# Patient Record
Sex: Female | Born: 1980 | Hispanic: No | Marital: Single | Smoking: Never smoker
Health system: Southern US, Community
[De-identification: ages and names within clinical notes are randomized; demographics above are authoritative.]

## PROBLEM LIST (undated history)

## (undated) HISTORY — PX: CARDIAC SURGERY: SHX584

---

## 1999-05-02 ENCOUNTER — Encounter: Admission: RE | Admit: 1999-05-02 | Discharge: 1999-05-04 | Payer: Self-pay | Admitting: *Deleted

## 1999-09-20 ENCOUNTER — Other Ambulatory Visit: Admission: RE | Admit: 1999-09-20 | Discharge: 1999-09-20 | Payer: Self-pay | Admitting: Obstetrics & Gynecology

## 1999-10-16 ENCOUNTER — Other Ambulatory Visit: Admission: RE | Admit: 1999-10-16 | Discharge: 1999-10-16 | Payer: Self-pay | Admitting: Obstetrics and Gynecology

## 2000-04-01 ENCOUNTER — Other Ambulatory Visit: Admission: RE | Admit: 2000-04-01 | Discharge: 2000-04-01 | Payer: Self-pay | Admitting: Obstetrics & Gynecology

## 2001-07-24 ENCOUNTER — Other Ambulatory Visit: Admission: RE | Admit: 2001-07-24 | Discharge: 2001-07-24 | Payer: Self-pay | Admitting: Obstetrics & Gynecology

## 2002-08-05 ENCOUNTER — Other Ambulatory Visit: Admission: RE | Admit: 2002-08-05 | Discharge: 2002-08-05 | Payer: Self-pay | Admitting: Obstetrics & Gynecology

## 2003-11-01 ENCOUNTER — Other Ambulatory Visit: Admission: RE | Admit: 2003-11-01 | Discharge: 2003-11-01 | Payer: Self-pay | Admitting: Obstetrics & Gynecology

## 2014-09-10 ENCOUNTER — Emergency Department (HOSPITAL_COMMUNITY)
Admission: EM | Admit: 2014-09-10 | Discharge: 2014-09-10 | Disposition: A | Discharge status: Home / Self Care | Payer: Medicaid Other | Attending: Emergency Medicine | Admitting: Emergency Medicine

## 2014-09-10 ENCOUNTER — Emergency Department (HOSPITAL_COMMUNITY): Payer: Medicaid Other

## 2014-09-10 ENCOUNTER — Encounter (HOSPITAL_COMMUNITY): Payer: Self-pay | Admitting: Nurse Practitioner

## 2014-09-10 DIAGNOSIS — J181 Lobar pneumonia, unspecified organism: Secondary | ICD-10-CM | POA: Diagnosis not present

## 2014-09-10 DIAGNOSIS — R509 Fever, unspecified: Secondary | ICD-10-CM | POA: Diagnosis present

## 2014-09-10 DIAGNOSIS — R05 Cough: Secondary | ICD-10-CM

## 2014-09-10 DIAGNOSIS — R059 Cough, unspecified: Secondary | ICD-10-CM

## 2014-09-10 DIAGNOSIS — Z792 Long term (current) use of antibiotics: Secondary | ICD-10-CM | POA: Insufficient documentation

## 2014-09-10 DIAGNOSIS — J189 Pneumonia, unspecified organism: Secondary | ICD-10-CM

## 2014-09-10 LAB — COMPREHENSIVE METABOLIC PANEL
ALT: 14 U/L (ref 0–35)
AST: 18 U/L (ref 0–37)
Albumin: 3.7 g/dL (ref 3.5–5.2)
Alkaline Phosphatase: 52 U/L (ref 39–117)
Anion gap: 9 (ref 5–15)
BUN: 6 mg/dL (ref 6–23)
CO2: 23 mmol/L (ref 19–32)
Calcium: 8.8 mg/dL (ref 8.4–10.5)
Chloride: 101 mmol/L (ref 96–112)
Creatinine, Ser: 0.63 mg/dL (ref 0.50–1.10)
GFR calc non Af Amer: >90 mL/min (ref >90)
Glucose, Bld: 108 mg/dL — ABNORMAL HIGH (ref 70–99)
Potassium: 3.4 mmol/L — ABNORMAL LOW (ref 3.5–5.1)
Sodium: 133 mmol/L — ABNORMAL LOW (ref 135–145)
TOTAL PROTEIN: 6.6 g/dL (ref 6.0–8.3)
Total Bilirubin: 0.5 mg/dL (ref 0.3–1.2)

## 2014-09-10 LAB — URINE MICROSCOPIC-ADD ON

## 2014-09-10 LAB — CBC WITH DIFFERENTIAL/PLATELET
BASOS PCT: 0 % (ref 0–1)
Basophils Absolute: 0 10*3/uL (ref 0.0–0.1)
EOS ABS: 0 10*3/uL (ref 0.0–0.7)
Eosinophils Relative: 0 % (ref 0–5)
HCT: 40.4 % (ref 36.0–46.0)
HEMOGLOBIN: 14.1 g/dL (ref 12.0–15.0)
Lymphocytes Relative: 15 % (ref 12–46)
Lymphs Abs: 1.1 10*3/uL (ref 0.7–4.0)
MCH: 30.6 pg (ref 26.0–34.0)
MCHC: 34.9 g/dL (ref 30.0–36.0)
MCV: 87.6 fL (ref 78.0–100.0)
Monocytes Absolute: 0.6 10*3/uL (ref 0.1–1.0)
Monocytes Relative: 8 % (ref 3–12)
Neutro Abs: 5.5 10*3/uL (ref 1.7–7.7)
Neutrophils Relative %: 77 % (ref 43–77)
Platelets: 187 10*3/uL (ref 150–400)
RBC: 4.61 MIL/uL (ref 3.87–5.11)
RDW: 12.6 % (ref 11.5–15.5)
WBC: 7.2 10*3/uL (ref 4.0–10.5)

## 2014-09-10 LAB — URINALYSIS, ROUTINE W REFLEX MICROSCOPIC
BILIRUBIN URINE: NEGATIVE
GLUCOSE, UA: NEGATIVE mg/dL
Ketones, ur: 15 mg/dL — AB
Leukocytes, UA: NEGATIVE
Nitrite: POSITIVE — AB
PH: 7 (ref 5.0–8.0)
PROTEIN: 30 mg/dL — AB
Specific Gravity, Urine: 1.02 (ref 1.005–1.030)
UROBILINOGEN UA: 2 mg/dL — AB (ref 0.0–1.0)

## 2014-09-10 LAB — I-STAT CG4 LACTIC ACID, ED: LACTIC ACID, VENOUS: 0.81 mmol/L (ref 0.5–2.0)

## 2014-09-10 MED ORDER — ONDANSETRON HCL 4 MG/2ML IJ SOLN
4.0000 mg | Freq: Once | INTRAMUSCULAR | Status: DC
Start: 2014-09-10 — End: 2014-09-10
  Filled 2014-09-10: qty 2

## 2014-09-10 MED ORDER — OSELTAMIVIR PHOSPHATE 75 MG PO CAPS
75.0000 mg | ORAL_CAPSULE | Freq: Once | ORAL | Status: DC
  Filled 2014-09-10: qty 1

## 2014-09-10 MED ORDER — OSELTAMIVIR PHOSPHATE 75 MG PO CAPS: 75.0000 mg | ORAL_CAPSULE | Freq: Two times a day (BID) | ORAL | Status: AC

## 2014-09-10 MED ORDER — AZITHROMYCIN 250 MG PO TABS: 250.0000 mg | ORAL_TABLET | Freq: Every day | ORAL | Status: AC

## 2014-09-10 MED ORDER — DEXTROSE 5 % IV SOLN: 500.0000 mg | Freq: Once | INTRAVENOUS | Status: DC

## 2014-09-10 MED ORDER — OSELTAMIVIR PHOSPHATE 75 MG PO CAPS: 75.0000 mg | ORAL_CAPSULE | Freq: Once | ORAL | Status: DC

## 2014-09-10 MED ORDER — KETOROLAC TROMETHAMINE 30 MG/ML IJ SOLN
30.0000 mg | Freq: Once | INTRAMUSCULAR | Status: DC
  Filled 2014-09-10: qty 1

## 2014-09-10 MED ORDER — ACETAMINOPHEN 500 MG PO TABS
1000.0000 mg | ORAL_TABLET | Freq: Once | ORAL | Status: AC
  Administered 2014-09-10: 1000 mg via ORAL
  Filled 2014-09-10: qty 2

## 2014-09-10 MED ORDER — SODIUM CHLORIDE 0.9 % IV BOLUS (SEPSIS)
1000.0000 mL | Freq: Once | INTRAVENOUS | Status: AC
  Administered 2014-09-10: 1000 mL via INTRAVENOUS

## 2014-09-10 MED ORDER — AMOXICILLIN-POT CLAVULANATE 875-125 MG PO TABS: 1.0000 | ORAL_TABLET | Freq: Two times a day (BID) | ORAL | Status: AC

## 2014-09-10 MED ORDER — AMOXICILLIN-POT CLAVULANATE 875-125 MG PO TABS
1.0000 | ORAL_TABLET | Freq: Once | ORAL | Status: AC
  Administered 2014-09-10: 1 via ORAL
  Filled 2014-09-10: qty 1

## 2014-09-10 MED ORDER — AZITHROMYCIN 250 MG PO TABS
250.0000 mg | ORAL_TABLET | Freq: Once | ORAL | Status: AC
  Administered 2014-09-10: 250 mg via ORAL
  Filled 2014-09-10: qty 1

## 2014-09-10 NOTE — ED Notes (Addendum)
She c/o headache, dizziness, n/v, fevers since Thursday. She took ibuprofen at 0600 this am with no relief.

## 2014-09-10 NOTE — ED Provider Notes (Signed)
CSN: [id number]     Arrival date & time 09/10/14  1407 History   First MD Initiated Contact with Patient 09/10/14 1410     Chief Complaint  Patient presents with  . Fever   HPI  Patient is a 34 year old female who presents emergency room for evaluation of fever, malaise, myalgias, cough 3 days. Patient states that on Thursday she became very fatigued and developed a fever and felt . She is having all over body aches. She is also having a dry cough with some headache. She denies rhinorrhea or congestion. Patient states that her headache came on gradually. She states that is all over. She denies any neck stiffness or rash. Her son was recently sick with URI past 2 weeks. She has no other sick contacts. Patient has been trying Tylenol and ibuprofen at home with little relief. Patient states she is otherwise healthy.  History reviewed. No pertinent past medical history. Past Surgical History  Procedure Laterality Date  . Cardiac surgery     History reviewed. No pertinent family history. History  Substance Use Topics  . Smoking status: Never Smoker   . Smokeless tobacco: Not on file  . Alcohol Use: Yes   OB History    No data available     Review of Systems  Constitutional: Negative for fever, chills and fatigue.  HENT: Negative for congestion, rhinorrhea and trouble swallowing.   Respiratory: Positive for cough. Negative for chest tightness and shortness of breath.   Cardiovascular: Negative for chest pain, palpitations and leg swelling.  Gastrointestinal: Positive for nausea and vomiting. Negative for abdominal pain, diarrhea and constipation.  Genitourinary: Negative for dysuria, urgency, frequency, hematuria, vaginal discharge and difficulty urinating.  Musculoskeletal: Negative for neck pain and neck stiffness.  Skin: Negative for rash.  Neurological: Positive for dizziness and headaches.  All other systems reviewed and are negative.     Allergies  Codeine; Iodine;  Levaquin; Rocephin; and Tequin  Home Medications   Prior to Admission medications   Medication Sig Start Date End Date Taking? Authorizing Provider  amoxicillin-clavulanate (AUGMENTIN) 875-125 MG per tablet Take 1 tablet by mouth 2 (two) times daily. One po bid x 7 days 09/10/14   Aliena Ghrist A Forcucci, PA-C  azithromycin (ZITHROMAX) 250 MG tablet Take 1 tablet (250 mg total) by mouth daily. Take first 2 tablets together, then 1 every day until finished. 09/10/14   Annalyssa Thune A Forcucci, PA-C  ibuprofen (ADVIL,MOTRIN) 200 MG tablet Take 200 mg by mouth every 6 (six) hours as needed for fever.   Yes Historical Provider, MD  oseltamivir (TAMIFLU) 75 MG capsule Take 1 capsule (75 mg total) by mouth every 12 (twelve) hours. 09/10/14   Giuliano Preece A Forcucci, PA-C   BP 115/71 mmHg  Pulse 77  Temp(Src) 99.6 F (37.6 C) (Oral)  Resp 20  Ht 5\' 7"  (1.702 m)  Wt 128 lb (58.06 kg)  BMI 20.04 kg/m2  SpO2 99% Physical Exam  Constitutional: She is oriented to person, place, and time. She appears well-developed and well-nourished. No distress.  HENT:  Head: Normocephalic and atraumatic.  Mouth/Throat: Oropharynx is clear and moist. No oropharyngeal exudate.  Eyes: Conjunctivae and EOM are normal. Pupils are equal, round, and reactive to light. No scleral icterus.  Neck: Normal range of motion. Neck supple. No JVD present. No Brudzinski's sign and no Kernig's sign noted. No thyromegaly present.  Cardiovascular: Normal rate, regular rhythm, normal heart sounds and intact distal pulses.  Exam reveals no gallop and no friction rub.  No murmur heard. Pulmonary/Chest: Effort normal and breath sounds normal. No respiratory distress. She has no wheezes. She has no rales. She exhibits no tenderness.  Abdominal: Soft. Bowel sounds are normal. She exhibits no distension and no mass. There is no tenderness. There is no rebound and no guarding.  Lymphadenopathy:    She has no cervical adenopathy.  Neurological: She is  alert and oriented to person, place, and time. She has normal strength. No cranial nerve deficit or sensory deficit. Coordination normal.  Skin: Skin is warm and dry. She is not diaphoretic.  Psychiatric: She has a normal mood and affect. Her behavior is normal. Judgment and thought content normal.  Nursing note and vitals reviewed.   ED Course  Procedures (including critical care time) Labs Review Labs Reviewed  COMPREHENSIVE METABOLIC PANEL - Abnormal; Notable for the following:    Sodium 133 (*)    Potassium 3.4 (*)    Glucose, Bld 108 (*)    All other components within normal limits  URINALYSIS, ROUTINE W REFLEX MICROSCOPIC - Abnormal; Notable for the following:    APPearance CLOUDY (*)    Hgb urine dipstick LARGE (*)    Ketones, ur 15 (*)    Protein, ur 30 (*)    Urobilinogen, UA 2.0 (*)    Nitrite POSITIVE (*)    All other components within normal limits  URINE MICROSCOPIC-ADD ON - Abnormal; Notable for the following:    Squamous Epithelial / LPF FEW (*)    Bacteria, UA MANY (*)    All other components within normal limits  URINE CULTURE  CULTURE, BLOOD (ROUTINE X 2)  CULTURE, BLOOD (ROUTINE X 2)  CBC WITH DIFFERENTIAL/PLATELET  I-STAT CG4 LACTIC ACID, ED    Imaging Review Dg Chest 2 View  09/10/2014   CLINICAL DATA:  Cough and fever  EXAM: CHEST  2 VIEW  COMPARISON:  None.  FINDINGS: Previous median sternotomy. Scoliosis rods are identified. Heart size is normal. No pleural effusion or edema. Airspace opacification within the lingular portion of the left lung identified. Right lung is clear.  IMPRESSION: 1. Lingular pneumonia.   Electronically Signed   By: Signa Kell M.D.   On: 09/10/2014 15:42     EKG Interpretation None      MDM   Final diagnoses:  Cough  Lingular pneumonia   Patient is a 34 year old female who presents emergency room for evaluation of fevers and malaise. Physical exam reveals an alert nontoxic-appearing female with a temperature of 103  on arrival. Lungs sound clear auscultation. UA reveals likely urinary tract infection. Urine culture pending. Blood cultures are pending. CBC is unremarkable. CMP is unremarkable. I-STAT lactic acid is negative. Chest x-ray reveals likely lingular pneumonia. We'll treat with Augmentin, azithromycin, and Tamiflu. Patient 2 return for worsening shortness of breath, intractable fevers, or any other concerning symptoms. Patient states understanding and agreement at this time. Patient refuses IV antibiotics. She refuses Toradol. There is no evidence for meningitis or nuchal rigidity at this time. Doubt encephalitis. Patient seen by and discussed with Dr. Silverio Lay who agrees with the above workup and plan. Patient stable for discharge at this time. She was able to ambulate with an average pulse ox of 99%. Patient given 1 L normal saline bolus here and Tylenol with relief of fever and tachycardia.    Eben Burow, PA-C 09/10/14 1718  Richardean Canal, MD 09/12/14 1500

## 2014-09-10 NOTE — ED Notes (Signed)
Patient reports she feels "the same"... Still refuses toradol and zofran.

## 2014-09-10 NOTE — ED Notes (Signed)
Patient does not want IV abx, requesting oral instead. Notified PA Courtney.

## 2014-09-10 NOTE — Discharge Instructions (Signed)
Pneumonia °Pneumonia is an infection of the lungs.  °CAUSES °Pneumonia may be caused by bacteria or a virus. Usually, these infections are caused by breathing infectious particles into the lungs (respiratory tract). °SIGNS AND SYMPTOMS  °· Cough. °· Fever. °· Chest pain. °· Increased rate of breathing. °· Wheezing. °· Mucus production. °DIAGNOSIS  °If you have the common symptoms of pneumonia, your health care provider will typically confirm the diagnosis with a chest X-ray. The X-ray will show an abnormality in the lung (pulmonary infiltrate) if you have pneumonia. Other tests of your blood, urine, or sputum may be done to find the specific cause of your pneumonia. Your health care provider may also do tests (blood gases or pulse oximetry) to see how well your lungs are working. °TREATMENT  °Some forms of pneumonia may be spread to other people when you cough or sneeze. You may be asked to wear a mask before and during your exam. Pneumonia that is caused by bacteria is treated with antibiotic medicine. Pneumonia that is caused by the influenza virus may be treated with an antiviral medicine. Most other viral infections must run their course. These infections will not respond to antibiotics.  °HOME CARE INSTRUCTIONS  °· Cough suppressants may be used if you are losing too much rest. However, coughing protects you by clearing your lungs. You should avoid using cough suppressants if you can. °· Your health care provider may have prescribed medicine if he or she thinks your pneumonia is caused by bacteria or influenza. Finish your medicine even if you start to feel better. °· Your health care provider may also prescribe an expectorant. This loosens the mucus to be coughed up. °· Take medicines only as directed by your health care provider. °· Do not smoke. Smoking is a common cause of bronchitis and can contribute to pneumonia. If you are a smoker and continue to smoke, your cough may last several weeks after your  pneumonia has cleared. °· A cold steam vaporizer or humidifier in your room or home may help loosen mucus. °· Coughing is often worse at night. Sleeping in a semi-upright position in a recliner or using a couple pillows under your head will help with this. °· Get rest as you feel it is needed. Your body will usually let you know when you need to rest. °PREVENTION °A pneumococcal shot (vaccine) is available to prevent a common bacterial cause of pneumonia. This is usually suggested for: °· People over 65 years old. °· Patients on chemotherapy. °· People with chronic lung problems, such as bronchitis or emphysema. °· People with immune system problems. °If you are over 65 or have a high risk condition, you may receive the pneumococcal vaccine if you have not received it before. In some countries, a routine influenza vaccine is also recommended. This vaccine can help prevent some cases of pneumonia. You may be offered the influenza vaccine as part of your care. °If you smoke, it is time to quit. You may receive instructions on how to stop smoking. Your health care provider can provide medicines and counseling to help you quit. °SEEK MEDICAL CARE IF: °You have a fever. °SEEK IMMEDIATE MEDICAL CARE IF:  °· Your illness becomes worse. This is especially true if you are elderly or weakened from any other disease. °· You cannot control your cough with suppressants and are losing sleep. °· You begin coughing up blood. °· You develop pain which is getting worse or is uncontrolled with medicines. °· Any of the symptoms   which initially brought you in for treatment are getting worse rather than better. °· You develop shortness of breath or chest pain. °MAKE SURE YOU:  °· Understand these instructions. °· Will watch your condition. °· Will get help right away if you are not doing well or get worse. °Document Released: 08/05/2005 Document Revised: 12/20/2013 Document Reviewed: 10/25/2010 °ExitCare® Patient Information ©2015  [employer]. This information is not intended to replace advice given to you by your health care provider. Make sure you discuss any questions you have with your health care provider. ° ° °Emergency Department Resource Guide °1) Find a Doctor and Pay Out of Pocket °Although you won't have to find out who is covered by your insurance plan, it is a good idea to ask around and get recommendations. You will then need to call the office and see if the doctor you have chosen will accept you as a new patient and what types of options they offer for patients who are self-pay. Some doctors offer discounts or will set up payment plans for their patients who do not have insurance, but you will need to ask so you aren't surprised when you get to your appointment. ° °2) Contact Your Local Health Department °Not all health departments have doctors that can see patients for sick visits, but many do, so it is worth a call to see if yours does. If you don't know where your local health department is, you can check in your phone book. The CDC also has a tool to help you locate your state's health department, and many state websites also have listings of all of their local health departments. ° °3) Find a Walk-in Clinic °If your illness is not likely to be very severe or complicated, you may want to try a walk in clinic. These are popping up all over the country in pharmacies, drugstores, and shopping centers. They're usually staffed by nurse practitioners or physician assistants that have been trained to treat common illnesses and complaints. They're usually fairly quick and inexpensive. However, if you have serious medical issues or chronic medical problems, these are probably not your best option. ° °No Primary Care Doctor: °- Call [facility] at  832-8000 - they can help you locate a primary care doctor that  accepts your insurance, provides certain services, etc. °- Physician Referral Service- 1-800-533-3463 ° °Chronic Pain  Problems: °Organization         Address  Phone   Notes  °[facility]  (336) 297-2271 Patients need to be referred by their primary care doctor.  ° °Medication Assistance: °Organization         Address  Phone   Notes  °[place] [facility] [address] °[place], [place] [address] (336) 641-8030 --Must be a resident of [place] °-- Must have NO insurance coverage whatsoever (no Medicaid/ Medicare, etc.) °-- The pt. MUST have a primary care doctor that directs their care regularly and follows them in the community °  °[facility]  (866) 331-1348   °[employer]  (888) 892-1162   ° °Agencies that provide inexpensive medical care: °Organization         Address  Phone   Notes  °[facility]  (336) 832-8035   °[facility]    (336) 832-7272   °[facility] [address] °[place], [place] [address] (336) 832-4777   °[facility] [address], °[place] (336) 271-4999   °  [facility]    (336) 373-0678   °[facility]    (336) 272-1050   °[facility] ° [address], [place] Phone:  (336) 832-4444, Fax:  (336) 832-4440 Hours of Operation:  9 am - 6 pm, M-F.  Also accepts Medicaid/Medicare and self-pay.  °[facility] ° [address], [place] Phone: (336) 832-3150, Fax: (336) 832-3151. Hours of Operation:  8:30 am - 5:30 pm, M-F.  Also accepts Medicaid and self-pay.  °[facility] [place] [address], [place] Phone: (336) 878-6027   °[facility] [address], [place], [place] (336)723-1848, Ext. 123 Mondays & Thursdays: 7-9 AM.  First 15 patients are seen on a first come, first serve basis. °  ° °Medicaid-accepting [place] Providers: ° °Organization         Address  Phone   Notes  °[facility] [address], [place] (336) 641-2100 Also  accepts self-pay patients.  °[facility] [address], [place] ° (336) 856-9996   °[facility] [address], [place] (336) 288-8857   °[facility] [address] [place] [address], [place] (336) 299-7000   °Veita Bland [address], [place]  ° (336) 373-1557 Only accepts [place] Access Medicaid patients after they have their name applied to their card.  ° °Self-Pay (no insurance) in [place]: ° °Organization         Address  Phone   Notes  °Sickle Cell Patients, [facility] [address], [place] (336) 832-1970   °[facility] [address], [place] (336) 832-4400   °[facility] ° [address],  (336) 992-4800   °[facility]/Dr. Osei-Bonsu ° [address] [place] [address], [place] or [address], [place] (336) 841-8500 Phone number for both [place] and [place] locations is the same.  °[facility] [address], [place] (336) 299-0000   °[facility] [address] [place] [address], [place] or [address] (336) 852-7530 °(336) 878-2260   °[facility] [address], [place] (336) 350-1642, phone; (336) 294-5005, fax Sees patients 1st and 3rd Saturday of every month.  Must not qualify for public or private insurance (i.e. Medicaid, Medicare, [place] Health Choice, Veterans' Benefits) • Household income should be no more than 200% of the poverty level •The clinic cannot treat you if you are pregnant or think you are pregnant • Sexually transmitted diseases are not treated at the clinic.  ° ° °Dental Care: °Organization         Address  Phone  Notes  °[facility] [address],  (336) 641-6152 Accepts children up to age 21 who are enrolled in Medicaid or [place] Health Choice; pregnant  women with a Medicaid card; and children who have applied for Medicaid or [place] Health Choice, but were declined, whose parents can pay a reduced fee at time of service.  °[facility] [place]  [address], [place] (336) 641-7733 Accepts children up to age 21 who are enrolled in Medicaid or [place] Health Choice; pregnant women with a Medicaid card; and children who have applied for Medicaid or [place] Health Choice, but were declined,   whose parents can pay a reduced fee at time of service.  °[facility] ° [address], [place] (336) 641-4533 Patients are seen by appointment only. Walk-ins are not accepted. [facility] will see patients 18 years of age and older. °Monday - Tuesday (8am-5pm) °Most Wednesdays (8:30-5pm) °$30 per visit, cash only  °[facility] ° [address], [place] (336) 641-4533 Patients are seen by appointment only. Walk-ins are not accepted. [facility] will see patients 18 years of age and older. °One Wednesday Evening (Monthly: Volunteer Based).  $30 per visit, cash only  °[facility]  (919) 537-3737 for adults; Children under age 4, call Graduate Pediatric Dentistry at (919) [phone]. Children aged 4-14, please call (919) [phone] to request a pediatric application. ° Dental services are provided in all areas of dental care including fillings, crowns and bridges, complete and partial dentures, implants, gum treatment, root canals, and extractions. Preventive care is also provided. Treatment is provided to both adults and children. °Patients are selected via a lottery and there is often a waiting list. °  °[facility] [address], °[place] ° (336) 763-8833 [email or web address] °  °[facility] [address], [place], [place] (336)723-1848, Ext. 123 Second and Fourth Thursday of each month, opens at 6:30 AM; Clinic ends at 9 AM.  Patients are  seen on a first-come first-served basis, and a limited number are seen during each clinic.  ° °[facility] ° [address], [place], [place] (336) 723-7904   Eligibility Requirements °You must have lived in [place], [place], or [place] counties for at least the last three months. °  You cannot be eligible for state or federal sponsored healthcare insurance, including Veterans Administration, Medicaid, or Medicare. °  You generally cannot be eligible for healthcare insurance through your employer.  °  How to apply: °Eligibility screenings are held every Tuesday and Wednesday afternoon from 1:00 pm until 4:00 pm. You do not need an appointment for the interview!  °[facility] [address], [place], [place] 336-631-2330   °[facility]  336-342-8273   °[facility]  336-703-3100   °[facility]  336-570-6415   ° °Behavioral Health Resources in the Community: °Intensive Outpatient Programs °Organization         Address  Phone  Notes  °[facility] [address], [place], [place] 336-878-6098   °[facility] [address], [place], [place] 336-832-9800   °[facility] [address], [place], [place] ° 336-882-2125   °[facility] [address],  °[place], [place] 1-800-853-5163 or 336-641-4981   °Substance Abuse Resources °Organization         Address  Phone  Notes  °[facility]  336-882-2125   °[facility]  336-784-9470   °[facility]  336-285-9073   °[facility]  336-845-3988   °Residential & Outpatient Substance Abuse Program  1-800-659-3381   °Psychological Services °Organization         Address  Phone  Notes  °[facility]  336- 832-9600   °[facility]  336- 378-7881   °[facility] [address], [place] 1-800-853-5163 or 336-641-4981   ° °Mobile Crisis  Teams °Organization         Address  Phone  Notes  °[facility]   Crisis Care Unit  1-877-626-1772   °Assertive °[facility] ° [address] [place], [place] 336-834-9664   °Sharon DeEsch [address] °[place] 336-554-5454   ° °Self-Help/Support Groups °Organization         Address  Phone             Notes  °[facility] - variety of support groups  336- 373-1402 Call for more information  °Narcotics Anonymous (NA), [facility] [address], °[place]  2 meetings at this location  ° °Residential Treatment Programs °Organization         Address  Phone  Notes  °[facility] [address],    °[place]  1-866-801-8205   °[facility] ° [address], [place], [place] 704-293-8524   °[facility] [address], [place] 336-845-3988 Admissions: 8am-3pm M-F  °[facility] [address]    °[place], [place] 336-841-1104   °[facility] [address], [place], [place] 336-379-7146   °[facility] [address]  °[place], [place] 336-285-9073   °[facility] - Intensive Outpatient [address], , [place] 336-852-3033   °[facility] [address]  °[place], [place] 1-877-615-2722 or 336-784-9470   °[facility] [address] [place], [place] 336-227-7417 Accepts Medicaid  °[facility] [address]  ° [place] 1-800-659-3381 Substance Abuse/Addiction Treatment  ° °[place] Behavioral Health Resources °Organization         Address  Phone  Notes  °[facility]  (888) 581-9988   °Julie Brannon, PhD [address] [place], [place]   (336) 349-5553 or (336) 951-0000   °[facility]   [address] °[place], [place] (336) 349-4454   °[facility] [address], [place], [place] (336) 342-8316  Insurance/Medicaid/sponsorship through [facility]  °[facility] [address]                                    [place], [place] (336) 342-8316 Therapy/tele-psych/case  °[facility] [address]  ° [place], [place] (336) 349-2233    °Dr. Arfeen  (336) 349-4544   °[facility] [place]  [employer] [place] [facility] 1) [address], [place] °2) [address], [place] °3)  [address], [place] (336) 349-3220 °(336) 342-7768 ° °(336) 342-8140   °[place] [facility] (336) 342-1394 or (336) 342-3537 (After Hours)    ° ° ° °

## 2014-09-10 NOTE — ED Notes (Signed)
99% 02 saturation while ambulating.

## 2014-09-13 LAB — URINE CULTURE
Colony Count: >=100000
Special Requests: NORMAL

## 2014-09-14 ENCOUNTER — Telehealth (HOSPITAL_BASED_OUTPATIENT_CLINIC_OR_DEPARTMENT_OTHER): Payer: Self-pay | Admitting: Emergency Medicine

## 2014-09-14 NOTE — Telephone Encounter (Signed)
Post ED Visit - Positive Culture Follow-up  Culture report reviewed by antimicrobial stewardship pharmacist: []  Wes Dulaney, Pharm.D., BCPS [x]  Celedonio MiyamotoJeremy Frens, Pharm.D., BCPS []  Georgina PillionElizabeth Martin, [address] BCPS []  LaskerMinh Pham, [address]Ph[address] BCPS, AAHIVP []  Estella HuskMichelle Turner, Pharm.D., BCPS, AAHIVP []  Elder CyphersLorie Poole, [address]Ph[address] BCPS  Positive urine culture E. Coli Treated with Amoxicillin-pot clavulanate, azithromycin, organism sensitive to the same and no further patient follow-up is required at this time.  Berle MullMiller, Sayra Frisby 09/14/2014, 12:06 PM

## 2014-09-17 LAB — CULTURE, BLOOD (ROUTINE X 2)
CULTURE: NO GROWTH
Culture: NO GROWTH

## 2015-08-02 IMAGING — CR DG CHEST 2V
2 series · 2 of 2 positions shown · non-contrast
Comparison: None.

CLINICAL DATA: Cough and fever

EXAM:
CHEST  2 VIEW

[w chest pa]
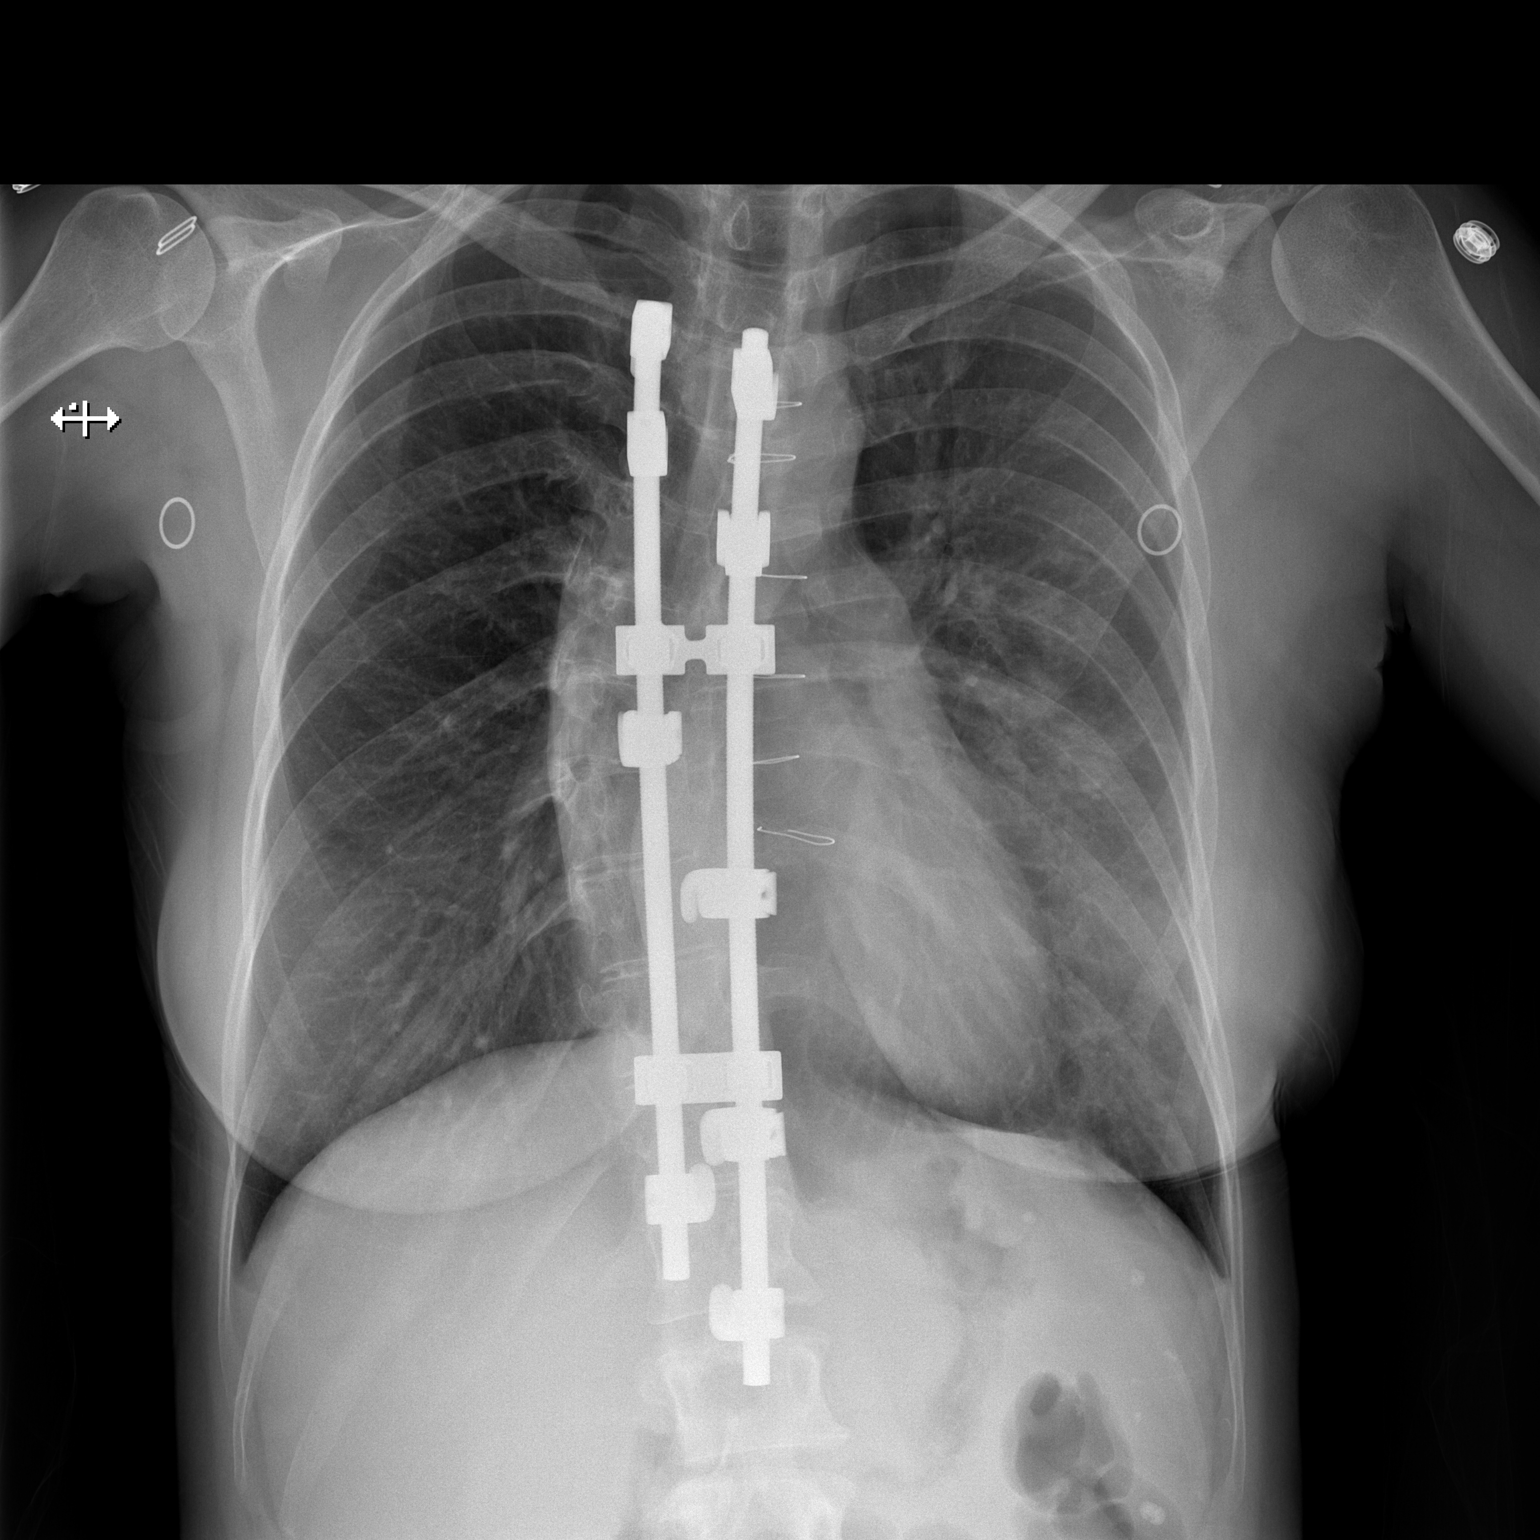

[w chest lat]
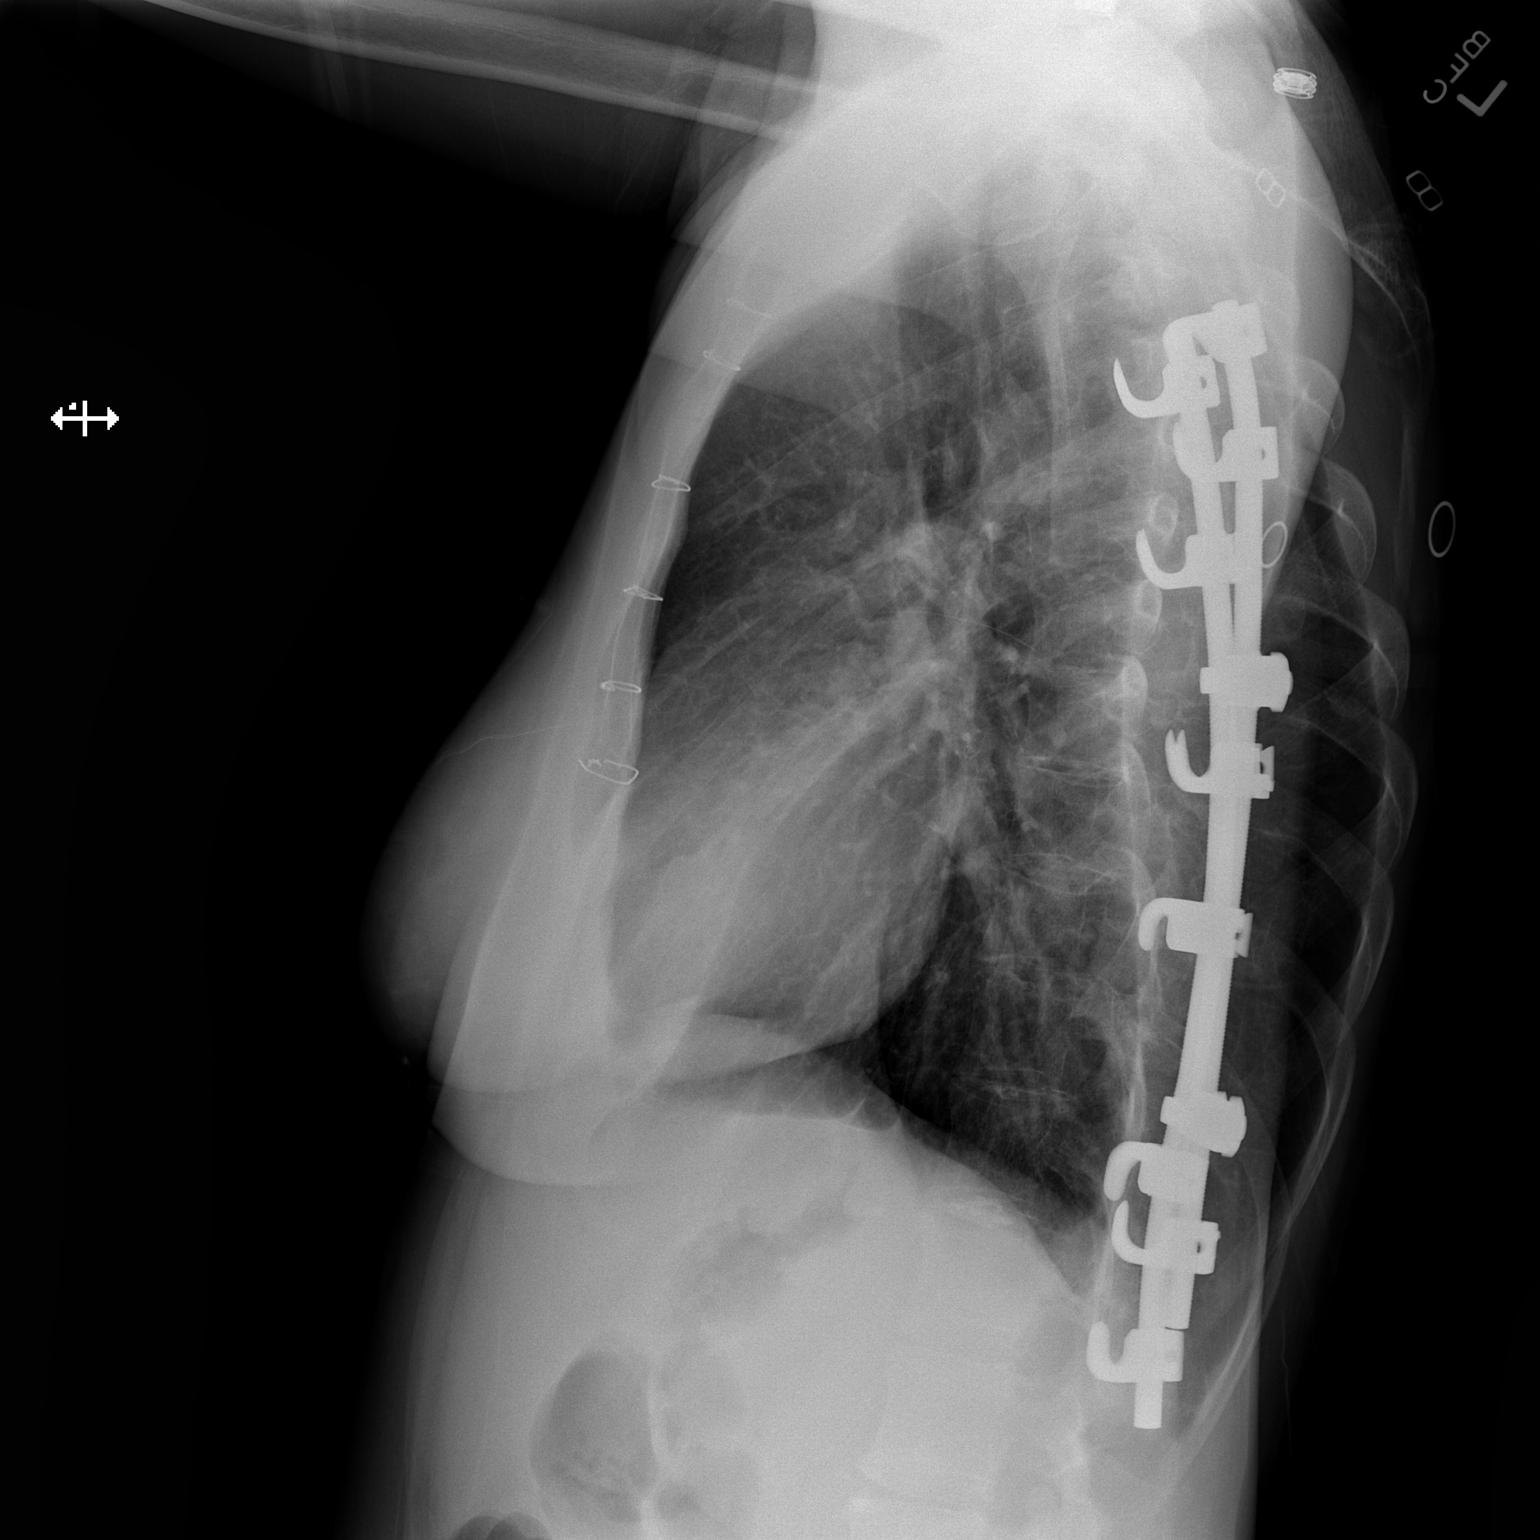

[2 of 2 positions shown; findings below may reference images not displayed]

FINDINGS: Previous median sternotomy. Scoliosis rods are identified. Heart
size is normal. No pleural effusion or edema. Airspace opacification
within the lingular portion of the left lung identified. Right lung
is clear.
IMPRESSION: 1. Lingular pneumonia.
